# Patient Record
Sex: Female | Born: 1995
Health system: Southern US, Community
[De-identification: ages and names within clinical notes are randomized; demographics above are authoritative.]

## PROBLEM LIST (undated history)

## (undated) DIAGNOSIS — R42 Dizziness and giddiness: Secondary | ICD-10-CM

## (undated) HISTORY — DX: Dizziness and giddiness: R42

---
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
## 2017-01-31 ENCOUNTER — Encounter: Payer: Self-pay | Admitting: Dietician

## 2017-01-31 NOTE — Progress Notes (Signed)
   [facility]  Visit Type:  Initial  Appt. Start Time: 2:15pm Appt. End Time: 3:00pm  01/30/17  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 Reports wt loss of 25# last year which she has regained and wants to lose again. Methods used for previous wt loss include increase in cardiovascular activities (running longer distances) and drastically reducing the amount of carbohydrates in her diet. This season she reports a change in training schedule d/t new [employer] coach, IE not running as much, but also is not eating at the dining halls as often and instead is choosing to cook more meals at home. Reports being a picky eater. Main vegetables consumed include asparagus, broccoli, potatoes, corn. Believes snack quality/selection to be one of her main "issue" areas hindering weight loss. Does not usually eat a snack prior to practice. Eats when it's "conventient" throughout the day.  Current workout schedule  M/W/F: 1.5hrs training followed by 1hr lift T/Th: pick up games with teammates Weekends: off  Usual eating schedule: Breakfast: not always, may have a snack food like goldfish or a fruit if at home. Occasionally will go to the dining hall and select omelet (cheese, bacon, spinach) + grits + pancake Lunch and Dinner: if at [employer] she chooses from provided selections- likes most meats, some vegetables, mashed potatoes. If at home she cooks a lot of pasta dishes. Also likes caesar salads with her meal Snacks: goldfish, fruit, poptarts, Nutrigrain bars, fruit rollups, popcorn, fruit snacks   Individualized Plan:   Learning Objective:  Patient will have a greater understanding of how to fuel properly for her sport (types of foods, meal / snack timing, ratio of different macronutrients, etc.) and of how to lose weight in a healthy way with  minimal impact on athletic performance.  RD to provide list of healthy meal ideas for athlete to cook at home. Will provide this list at our follow up appointment.   Expected Outcomes:     1. Reach goal weight 2. Improved athletic performance 3. Consumption of more nutrient-dense snacks and more balanced meals  Education material provided: healthy snack ideas  If problems or questions, patient to contact team via:  Phone and Email  Future appointment:  Thursday Oct 4th :15pm    Marianna Payment, RD 01/30/17

## 2017-03-18 ENCOUNTER — Other Ambulatory Visit: Payer: Self-pay | Admitting: Family Medicine

## 2017-03-18 DIAGNOSIS — S4992XA Unspecified injury of left shoulder and upper arm, initial encounter: Secondary | ICD-10-CM

## 2017-03-19 ENCOUNTER — Ambulatory Visit
Admission: RE | Admit: 2017-03-19 | Discharge: 2017-03-19 | Disposition: A | Discharge status: Home / Self Care | Payer: Federal, State, Local not specified - PPO | Source: Ambulatory Visit | Attending: Family Medicine | Admitting: Family Medicine

## 2017-03-19 ENCOUNTER — Other Ambulatory Visit: Payer: Self-pay | Admitting: Family Medicine

## 2017-03-19 DIAGNOSIS — S4992XA Unspecified injury of left shoulder and upper arm, initial encounter: Secondary | ICD-10-CM

## 2017-03-19 DIAGNOSIS — Y9367 Activity, basketball: Secondary | ICD-10-CM | POA: Insufficient documentation

## 2017-03-19 DIAGNOSIS — M25512 Pain in left shoulder: Secondary | ICD-10-CM | POA: Diagnosis not present

## 2017-03-19 MED ORDER — NAPROXEN 500 MG PO TABS: 500.0000 mg | ORAL_TABLET | Freq: Two times a day (BID) | ORAL | 0 refills | Status: DC

## 2017-03-24 DIAGNOSIS — S4352XA Sprain of left acromioclavicular joint, initial encounter: Secondary | ICD-10-CM | POA: Diagnosis not present

## 2017-03-25 ENCOUNTER — Other Ambulatory Visit: Payer: Self-pay | Admitting: Family Medicine

## 2017-03-25 DIAGNOSIS — M898X9 Other specified disorders of bone, unspecified site: Secondary | ICD-10-CM

## 2017-03-26 ENCOUNTER — Ambulatory Visit
Admission: RE | Admit: 2017-03-26 | Discharge: 2017-03-26 | Disposition: A | Discharge status: Home / Self Care | Payer: Federal, State, Local not specified - PPO | Source: Ambulatory Visit | Attending: Family Medicine | Admitting: Family Medicine

## 2017-03-26 DIAGNOSIS — M898X9 Other specified disorders of bone, unspecified site: Secondary | ICD-10-CM | POA: Insufficient documentation

## 2017-03-26 DIAGNOSIS — M25512 Pain in left shoulder: Secondary | ICD-10-CM | POA: Diagnosis not present

## 2017-04-10 ENCOUNTER — Encounter: Payer: Self-pay | Admitting: Family Medicine

## 2017-04-10 ENCOUNTER — Ambulatory Visit (INDEPENDENT_AMBULATORY_CARE_PROVIDER_SITE_OTHER): Payer: Federal, State, Local not specified - PPO | Admitting: Family Medicine

## 2017-04-10 VITALS — BP 125/67 | HR 63 | Temp 98.9°F | Resp 14

## 2017-04-10 DIAGNOSIS — R0981 Nasal congestion: Secondary | ICD-10-CM

## 2017-04-10 NOTE — Progress Notes (Signed)
Patient presents today with symptoms of persistent nasal drainage. Patient states that she has had postnasal drip for years. She states that she seems more congested in the recent days. She denies any history of deviated septum/nasal fracture. She denies any colored mucus or fever at this point. She denies any sore throat or cough. She has not been allergy tested before. She does take Zyrtec on a regular basis and also a nasal steroid spray. She has not tried Sudafed in the short-term. She does admit to seeing an ENT in the past who stated that she may just have allergies.  ROS: Negative except mentioned above. Vitals as per Epic. GENERAL: NAD HEENT: mild pharyngeal erythema, no exudate, no erythema of TMs, mild erythema and swelling of bilateral nares, no sinus tenderness, no cervical LAD RESP: CTA B CARD: RRR NEURO: CN II-XII grossly intact   A/P: Chronic nasal drainage last congestion - patient can try a different antihistamine such as Claritin or Allegra and in the short-term can try taking Sudafed, Ibuprofen when necessary, continue nasal steroid, if symptoms do persist/worsen can see local ENT in the area for further evaluation/treatment and possibly allergy testing. Seek medical attention as needed.

## 2017-08-22 ENCOUNTER — Ambulatory Visit (INDEPENDENT_AMBULATORY_CARE_PROVIDER_SITE_OTHER): Payer: Federal, State, Local not specified - PPO | Admitting: Family Medicine

## 2017-08-22 ENCOUNTER — Encounter: Payer: Self-pay | Admitting: Family Medicine

## 2017-08-22 VITALS — BP 121/63 | HR 85 | Temp 98.2°F | Resp 14

## 2017-08-22 DIAGNOSIS — J029 Acute pharyngitis, unspecified: Secondary | ICD-10-CM | POA: Diagnosis not present

## 2017-08-22 DIAGNOSIS — R0981 Nasal congestion: Secondary | ICD-10-CM | POA: Diagnosis not present

## 2017-08-22 LAB — POCT RAPID STREP A (OFFICE): Rapid Strep A Screen: NEGATIVE

## 2017-08-22 NOTE — Progress Notes (Signed)
Patient presents today with one day history of sore throat, nasal congestion, fatigue. She feels that she may be coming down with something. Patient states that she does take Claritin for her allergies daily. She denies any fever, abdominal pain, severe headache, nausea, vomiting, diarrhea, myalgias. She is not taking any medications today.she does not get her monthly menstrual cycle due to her birth control use.  ROS: Negative except mentioned above. Vitals as per Epic.  GENERAL: NAD HEENT: mild pharyngeal erythema, no exudate, no erythema of TMs, no cervical LAD RESP: CTA B CARD: RRR ABD: positive bowel sounds, nontender, no organomegaly appreciated NEURO: CN II-XII grossly intact   A/P: URI - would recommend continuing to take Claritin for postnasal drip, Sudafed if needed for nasal congestion/pressure, Tylenol/ibuprofen when necessary for headache or fever, rest, hydration, monitor for any worsening symptoms including fever, inform trainer if any worsening symptoms, recommend no class or physical activity if febrile, seek medical attention if any further problems.

## 2018-01-01 DIAGNOSIS — Z3041 Encounter for surveillance of contraceptive pills: Secondary | ICD-10-CM | POA: Diagnosis not present

## 2018-01-01 DIAGNOSIS — K080 Exfoliation of teeth due to systemic causes: Secondary | ICD-10-CM | POA: Diagnosis not present

## 2018-01-01 DIAGNOSIS — Z01419 Encounter for gynecological examination (general) (routine) without abnormal findings: Secondary | ICD-10-CM | POA: Diagnosis not present

## 2018-01-29 DIAGNOSIS — S39012A Strain of muscle, fascia and tendon of lower back, initial encounter: Secondary | ICD-10-CM | POA: Diagnosis not present

## 2018-02-03 DIAGNOSIS — S39012D Strain of muscle, fascia and tendon of lower back, subsequent encounter: Secondary | ICD-10-CM | POA: Diagnosis not present

## 2018-02-04 DIAGNOSIS — S39012D Strain of muscle, fascia and tendon of lower back, subsequent encounter: Secondary | ICD-10-CM | POA: Diagnosis not present

## 2018-02-06 DIAGNOSIS — S39012D Strain of muscle, fascia and tendon of lower back, subsequent encounter: Secondary | ICD-10-CM | POA: Diagnosis not present

## 2018-02-09 DIAGNOSIS — S39012D Strain of muscle, fascia and tendon of lower back, subsequent encounter: Secondary | ICD-10-CM | POA: Diagnosis not present

## 2018-02-10 DIAGNOSIS — S39012D Strain of muscle, fascia and tendon of lower back, subsequent encounter: Secondary | ICD-10-CM | POA: Diagnosis not present

## 2018-02-11 DIAGNOSIS — S39012D Strain of muscle, fascia and tendon of lower back, subsequent encounter: Secondary | ICD-10-CM | POA: Diagnosis not present

## 2018-02-13 DIAGNOSIS — S39012D Strain of muscle, fascia and tendon of lower back, subsequent encounter: Secondary | ICD-10-CM | POA: Diagnosis not present

## 2018-02-14 DIAGNOSIS — S39012D Strain of muscle, fascia and tendon of lower back, subsequent encounter: Secondary | ICD-10-CM | POA: Diagnosis not present

## 2018-02-16 DIAGNOSIS — S39012D Strain of muscle, fascia and tendon of lower back, subsequent encounter: Secondary | ICD-10-CM | POA: Diagnosis not present

## 2018-02-17 DIAGNOSIS — S39012D Strain of muscle, fascia and tendon of lower back, subsequent encounter: Secondary | ICD-10-CM | POA: Diagnosis not present

## 2018-02-18 DIAGNOSIS — S39012D Strain of muscle, fascia and tendon of lower back, subsequent encounter: Secondary | ICD-10-CM | POA: Diagnosis not present

## 2018-02-19 DIAGNOSIS — S39012D Strain of muscle, fascia and tendon of lower back, subsequent encounter: Secondary | ICD-10-CM | POA: Diagnosis not present

## 2018-02-20 DIAGNOSIS — S39012D Strain of muscle, fascia and tendon of lower back, subsequent encounter: Secondary | ICD-10-CM | POA: Diagnosis not present

## 2018-02-21 DIAGNOSIS — S39012D Strain of muscle, fascia and tendon of lower back, subsequent encounter: Secondary | ICD-10-CM | POA: Diagnosis not present

## 2018-02-23 DIAGNOSIS — S39012D Strain of muscle, fascia and tendon of lower back, subsequent encounter: Secondary | ICD-10-CM | POA: Diagnosis not present

## 2018-02-24 DIAGNOSIS — S39012D Strain of muscle, fascia and tendon of lower back, subsequent encounter: Secondary | ICD-10-CM | POA: Diagnosis not present

## 2018-03-02 DIAGNOSIS — S39012D Strain of muscle, fascia and tendon of lower back, subsequent encounter: Secondary | ICD-10-CM | POA: Diagnosis not present

## 2018-03-03 DIAGNOSIS — S39012D Strain of muscle, fascia and tendon of lower back, subsequent encounter: Secondary | ICD-10-CM | POA: Diagnosis not present

## 2018-03-04 DIAGNOSIS — S39012D Strain of muscle, fascia and tendon of lower back, subsequent encounter: Secondary | ICD-10-CM | POA: Diagnosis not present

## 2018-03-09 DIAGNOSIS — S39012D Strain of muscle, fascia and tendon of lower back, subsequent encounter: Secondary | ICD-10-CM | POA: Diagnosis not present

## 2018-04-16 ENCOUNTER — Ambulatory Visit (INDEPENDENT_AMBULATORY_CARE_PROVIDER_SITE_OTHER): Payer: Federal, State, Local not specified - PPO | Admitting: Family Medicine

## 2018-04-16 ENCOUNTER — Encounter: Payer: Self-pay | Admitting: Family Medicine

## 2018-04-16 VITALS — Resp 14

## 2018-04-16 DIAGNOSIS — S99921A Unspecified injury of right foot, initial encounter: Secondary | ICD-10-CM

## 2018-04-16 MED ORDER — NAPROXEN 500 MG PO TABS: 500.0000 mg | ORAL_TABLET | Freq: Two times a day (BID) | ORAL | 0 refills | Status: AC

## 2018-04-20 ENCOUNTER — Ambulatory Visit
Admission: RE | Admit: 2018-04-20 | Discharge: 2018-04-20 | Disposition: A | Discharge status: Home / Self Care | Payer: Federal, State, Local not specified - PPO | Source: Ambulatory Visit | Attending: Family Medicine | Admitting: Family Medicine

## 2018-04-20 ENCOUNTER — Ambulatory Visit
Admission: RE | Admit: 2018-04-20 | Discharge: 2018-04-20 | Disposition: A | Discharge status: Home / Self Care | Payer: Federal, State, Local not specified - PPO | Attending: Family Medicine | Admitting: Family Medicine

## 2018-04-20 DIAGNOSIS — S99921A Unspecified injury of right foot, initial encounter: Secondary | ICD-10-CM | POA: Diagnosis not present

## 2018-04-20 DIAGNOSIS — M79674 Pain in right toe(s): Secondary | ICD-10-CM | POA: Diagnosis not present

## 2018-05-26 ENCOUNTER — Ambulatory Visit (INDEPENDENT_AMBULATORY_CARE_PROVIDER_SITE_OTHER): Payer: Federal, State, Local not specified - PPO | Admitting: Family Medicine

## 2018-05-26 VITALS — BP 112/69 | HR 75 | Temp 98.4°F | Resp 14

## 2018-05-26 DIAGNOSIS — K529 Noninfective gastroenteritis and colitis, unspecified: Secondary | ICD-10-CM

## 2018-05-26 MED ORDER — ONDANSETRON 4 MG PO TBDP: 4.0000 mg | ORAL_TABLET | Freq: Three times a day (TID) | ORAL | 0 refills | Status: AC | PRN

## 2018-05-26 MED ORDER — DICYCLOMINE HCL 20 MG PO TABS: 20.0000 mg | ORAL_TABLET | Freq: Three times a day (TID) | ORAL | 0 refills | Status: AC

## 2018-05-26 NOTE — Progress Notes (Signed)
Patient presents today with symptoms of nausea, vomiting, diarrhea.  Patient states that her symptoms started early Monday morning.  She ate some pasta and vegetables with meat sauce on Sunday night.  She denied any alcohol use or drug use.  She stopped vomiting yesterday afternoon.  She has had some loose bowel movements since that time.  She has been able to tolerate fluids today.  She denies any significant abdominal pain but does have abdominal cramping at times.  She denies any fever, bloody diarrhea, urinary symptoms, upper respiratory symptoms, chest pain, shortness of breath, severe headache.  She is on birth control and does not get her menstrual cycles.  She did not go to class today.  Has tried Alka-Seltzer for her symptoms with some relief.  ROS: Negative except mentioned above. Vitals as per Epic. GENERAL: NAD HEENT: no pharyngeal erythema, no exudate, no erythema of TMs, no cervical LAD RESP: CTA B CARD: RRR ABD: Positive bowel sounds, no significant tenderness, no rebound or guarding appreciated, no flank tenderness NEURO: CN II-XII grossly intact   A/P: Gastroenteritis - rest, hydration, Tylenol as needed, Bentyl prescribed if patient wants to take it for cramping, Zofran prescribed if patient wants to take it for nausea, BRAT Diet discussed, no athletic activity or class today, can return back to class I symptoms have resolved, once patient is tolerating normal diet and symptoms have resolved can return back to physical activity, will discuss plan with athletic trainer.  Seek medical attention if symptoms persist or worsen as discussed.

## 2018-10-25 NOTE — Progress Notes (Signed)
Patient presents with pain of right first toe. She believes someone stepped on it during a basketball game a few weeks ago. She has been able to play through it. Has been taping it. Denies any bleeding or bruising of the area. Denies any alcohol use or any other joint pain. Denies any previous history of toe problems in the past.   ROS: Negative except mentioned above. Vitals as per Epic.  GENERAL: NAD MSK: R First Toe - slight hallus valgus, nail intact, no significant swelling or ecchymosis, tenderness along MTP joint with flexion and extension, no tenderness along plantar surface, nv intact NEURO: CN II-XII grossly intact   A/P: R First Toe Pain - will do Xray, tape as needed, discussed trainer trying certain insert, discussed proper shoewear, NSAIDs prn, activity as tolerated, discussed plan with athletic trainer.

## 2018-11-13 ENCOUNTER — Other Ambulatory Visit: Payer: Self-pay | Admitting: *Deleted

## 2018-11-13 DIAGNOSIS — Z20822 Contact with and (suspected) exposure to covid-19: Secondary | ICD-10-CM

## 2018-11-16 ENCOUNTER — Other Ambulatory Visit: Payer: Self-pay

## 2018-11-16 DIAGNOSIS — Z20822 Contact with and (suspected) exposure to covid-19: Secondary | ICD-10-CM

## 2018-11-16 DIAGNOSIS — R6889 Other general symptoms and signs: Secondary | ICD-10-CM | POA: Diagnosis not present

## 2018-11-21 LAB — NOVEL CORONAVIRUS, NAA: SARS-CoV-2, NAA: NOT DETECTED

## 2018-11-25 ENCOUNTER — Other Ambulatory Visit: Payer: Self-pay | Admitting: Family Medicine

## 2018-11-27 LAB — 5+CRT-BUND
Amphetamines, Urine: NEGATIVE ng/mL
Cannabinoid Quant, Ur: NEGATIVE ng/mL
Cocaine (Metab.): NEGATIVE ng/mL
Creatinine, Urine: 98.7 mg/dL (ref 20.0–300.0)
OPIATE QUANTITATIVE URINE: NEGATIVE ng/mL
PCP Quant, Ur: NEGATIVE ng/mL
pH, Urine: 6.5 (ref 4.5–8.9)

## 2019-01-06 DIAGNOSIS — N946 Dysmenorrhea, unspecified: Secondary | ICD-10-CM | POA: Diagnosis not present

## 2019-01-06 DIAGNOSIS — Z1239 Encounter for other screening for malignant neoplasm of breast: Secondary | ICD-10-CM | POA: Diagnosis not present

## 2019-01-06 DIAGNOSIS — Z01419 Encounter for gynecological examination (general) (routine) without abnormal findings: Secondary | ICD-10-CM | POA: Diagnosis not present

## 2019-01-06 DIAGNOSIS — Z793 Long term (current) use of hormonal contraceptives: Secondary | ICD-10-CM | POA: Diagnosis not present

## 2019-01-20 DIAGNOSIS — Z20828 Contact with and (suspected) exposure to other viral communicable diseases: Secondary | ICD-10-CM | POA: Diagnosis not present

## 2019-02-02 ENCOUNTER — Other Ambulatory Visit: Payer: Self-pay | Admitting: *Deleted

## 2019-02-02 DIAGNOSIS — Z20822 Contact with and (suspected) exposure to covid-19: Secondary | ICD-10-CM

## 2019-02-02 DIAGNOSIS — R6889 Other general symptoms and signs: Secondary | ICD-10-CM | POA: Diagnosis not present

## 2019-02-03 LAB — NOVEL CORONAVIRUS, NAA: SARS-CoV-2, NAA: NOT DETECTED

## 2019-02-10 ENCOUNTER — Other Ambulatory Visit: Payer: Self-pay

## 2019-02-10 DIAGNOSIS — Z20822 Contact with and (suspected) exposure to covid-19: Secondary | ICD-10-CM

## 2019-02-11 LAB — NOVEL CORONAVIRUS, NAA: SARS-CoV-2, NAA: NOT DETECTED

## 2019-02-24 DIAGNOSIS — Z20828 Contact with and (suspected) exposure to other viral communicable diseases: Secondary | ICD-10-CM | POA: Diagnosis not present

## 2019-03-03 DIAGNOSIS — Z20828 Contact with and (suspected) exposure to other viral communicable diseases: Secondary | ICD-10-CM | POA: Diagnosis not present

## 2019-03-11 DIAGNOSIS — Z20828 Contact with and (suspected) exposure to other viral communicable diseases: Secondary | ICD-10-CM | POA: Diagnosis not present

## 2019-03-17 DIAGNOSIS — Z20828 Contact with and (suspected) exposure to other viral communicable diseases: Secondary | ICD-10-CM | POA: Diagnosis not present

## 2019-03-23 DIAGNOSIS — Z20828 Contact with and (suspected) exposure to other viral communicable diseases: Secondary | ICD-10-CM | POA: Diagnosis not present

## 2019-03-29 DIAGNOSIS — Z20828 Contact with and (suspected) exposure to other viral communicable diseases: Secondary | ICD-10-CM | POA: Diagnosis not present

## 2019-03-31 DIAGNOSIS — Z20828 Contact with and (suspected) exposure to other viral communicable diseases: Secondary | ICD-10-CM | POA: Diagnosis not present

## 2019-04-02 DIAGNOSIS — Z20828 Contact with and (suspected) exposure to other viral communicable diseases: Secondary | ICD-10-CM | POA: Diagnosis not present

## 2019-04-06 DIAGNOSIS — Z20828 Contact with and (suspected) exposure to other viral communicable diseases: Secondary | ICD-10-CM | POA: Diagnosis not present

## 2019-04-10 DIAGNOSIS — Z20828 Contact with and (suspected) exposure to other viral communicable diseases: Secondary | ICD-10-CM | POA: Diagnosis not present

## 2019-04-12 DIAGNOSIS — Z20828 Contact with and (suspected) exposure to other viral communicable diseases: Secondary | ICD-10-CM | POA: Diagnosis not present

## 2019-08-07 IMAGING — CR DG CLAVICLE*L*
1 series · 2 of 2 positions shown · non-contrast
Comparison: None.

CLINICAL DATA: Pt states she collided with another player during a
basketball game yesterday. Pain anterior left clavicle.

EXAM:
LEFT CLAVICLE - 2+ VIEWS

[Series 1: dg clavicle left · 0.14mm/px · 2 of 2 slices shown]
[im 1/2]
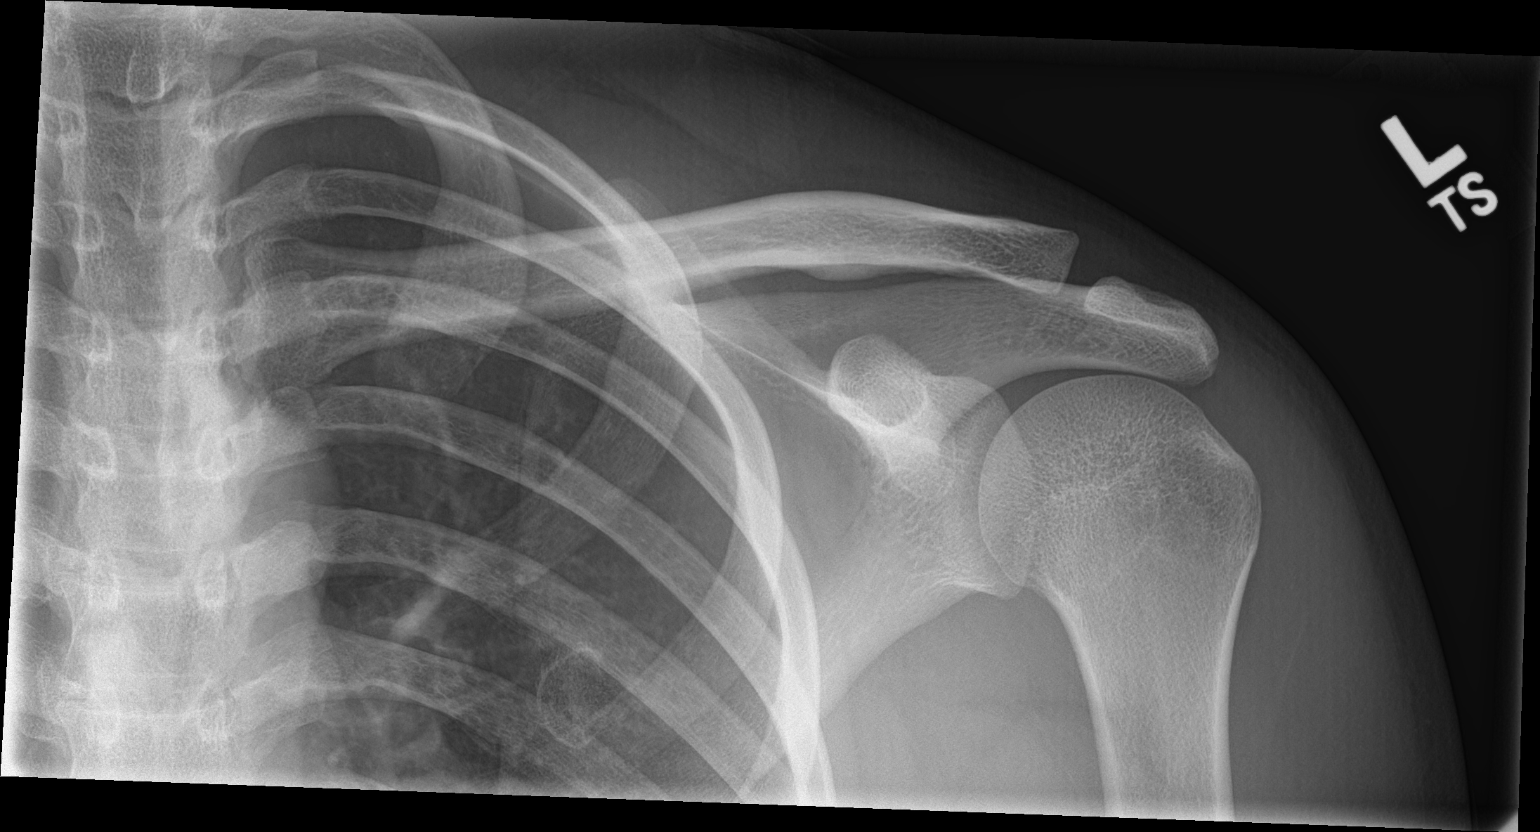
[im 2/2]
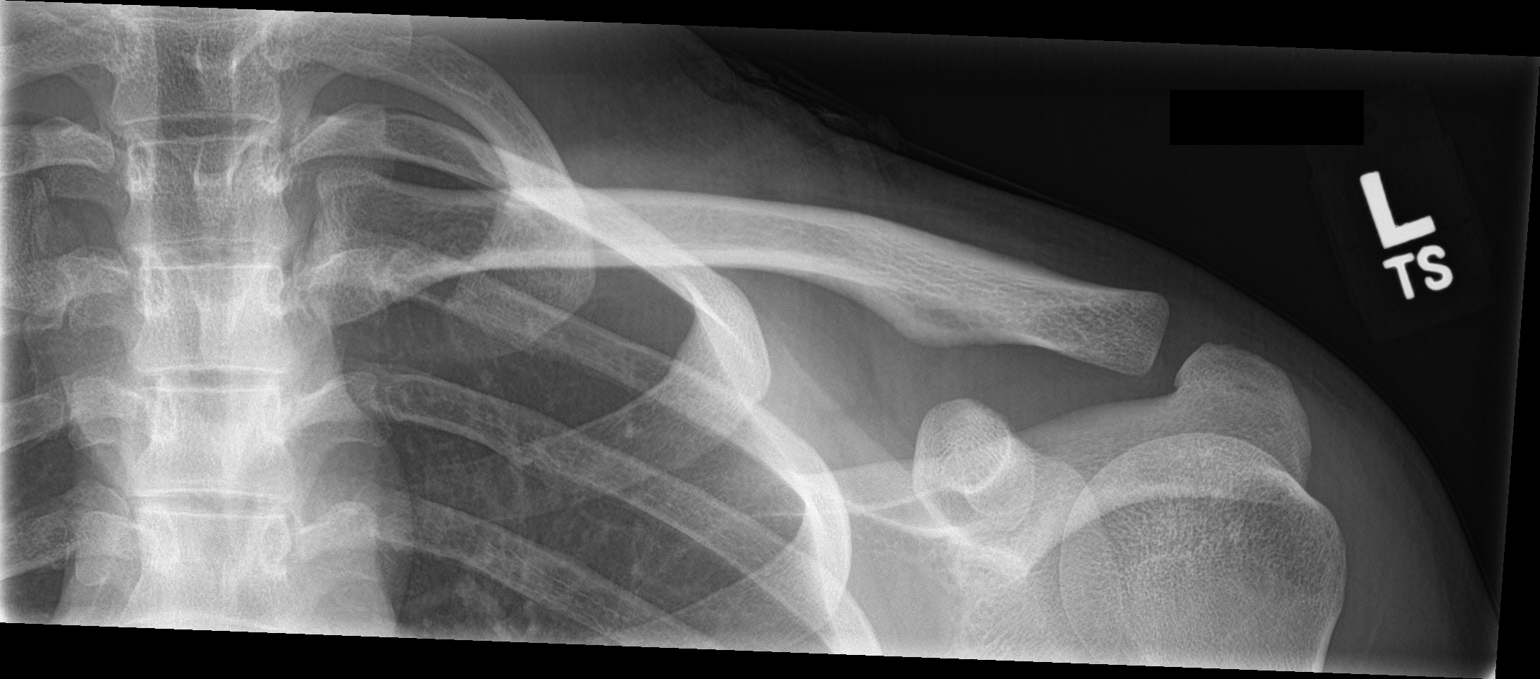

[2 of 2 positions shown; findings below may reference images not displayed]

FINDINGS: There is no evidence of fracture or other focal bone lesions. The
visualized glenohumeral joint is normal. The acromioclavicular joint
is normal. The of soft tissues are unremarkable.
IMPRESSION: No acute osseous injury of the left clavicle.

## 2019-08-09 ENCOUNTER — Ambulatory Visit: Payer: Federal, State, Local not specified - PPO | Attending: Internal Medicine

## 2019-08-09 DIAGNOSIS — Z23 Encounter for immunization: Secondary | ICD-10-CM

## 2019-08-09 NOTE — Progress Notes (Signed)
   Cov[facility] Vaccination [facility]  Name:  Madison Burke    MRN: [id number] DOB: 04/08/1996  08/09/2019  Madison Burke was observed post Covid-19 immunization for 15 minutes without incident. She was provided with Vaccine Information Sheet and instruction to access the V-Safe system.   Madison Burke was instructed to call 911 with any severe reactions post vaccine: Marland Kitchen Difficulty breathing  . Swelling of face and throat  . A fast heartbeat  . A bad rash all over body  . Dizziness and weakness   Immunizations Administered    Name Date Dose VIS Date Route   Pfizer COVID-19 Vaccine 08/09/2019  2:29 PM 0.3 mL 04/23/2019 Intramuscular   Manufacturer: [employer], [employer]   Lot: [id number]   NDC: [address]-8

## 2019-08-14 IMAGING — CR DG SHOULDER 2+V*L*
1 series · 4 of 4 positions shown · non-contrast
Comparison: 03/19/2017

CLINICAL DATA: Possible bone mass

EXAM:
LEFT SHOULDER - 2+ VIEW

[Series 1: dg shoulder left · 0.14mm/px · 4 of 4 slices shown]
[im 1/4]
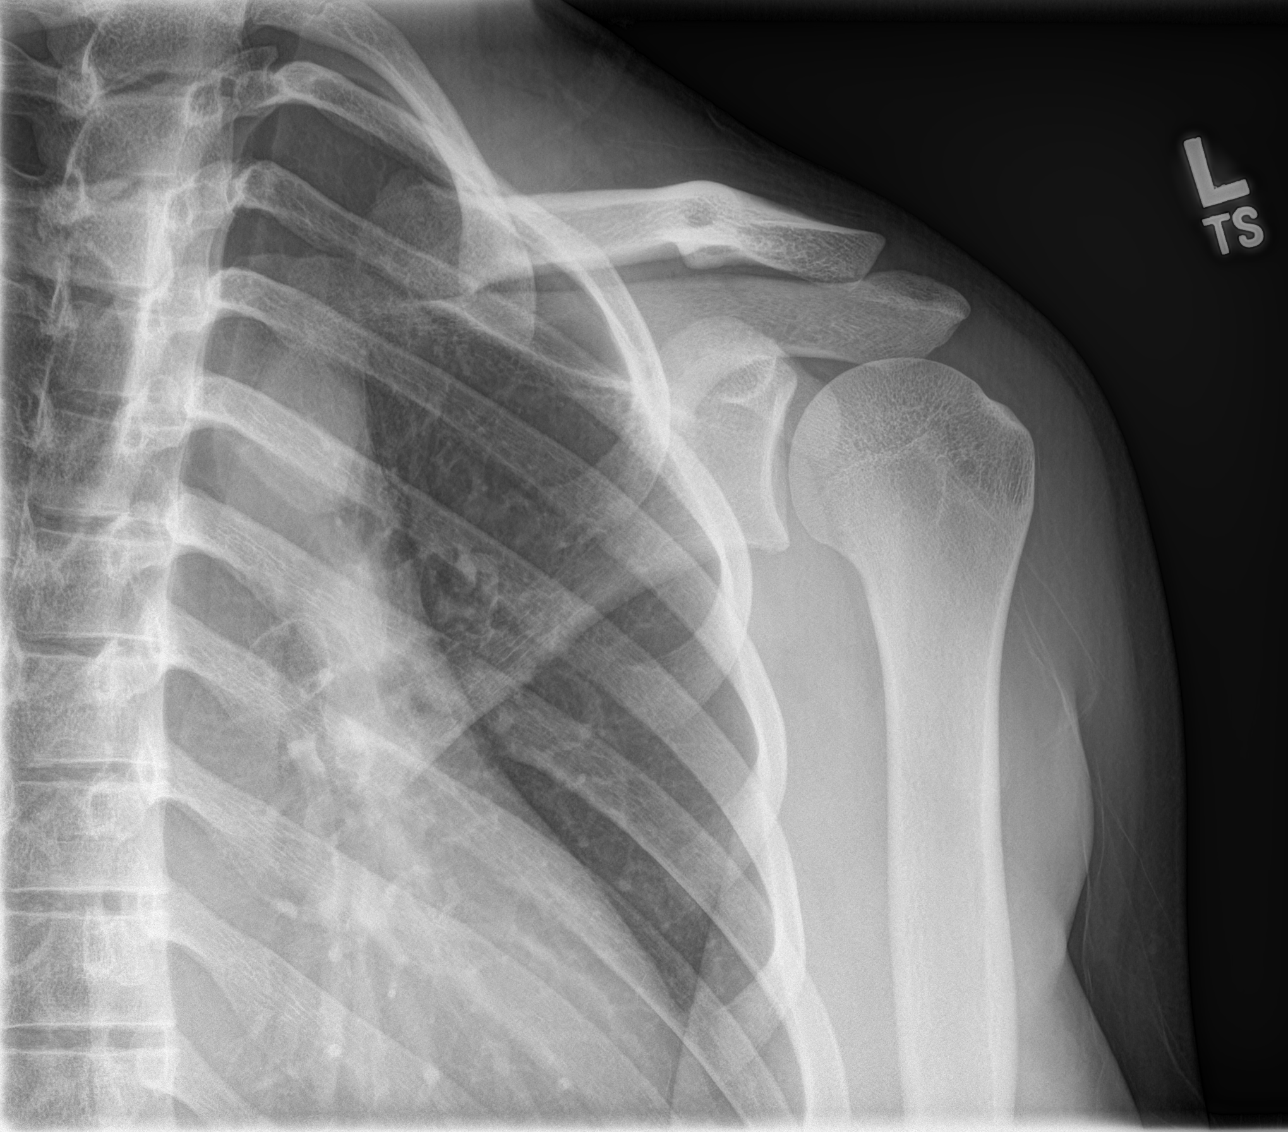
[im 2/4]
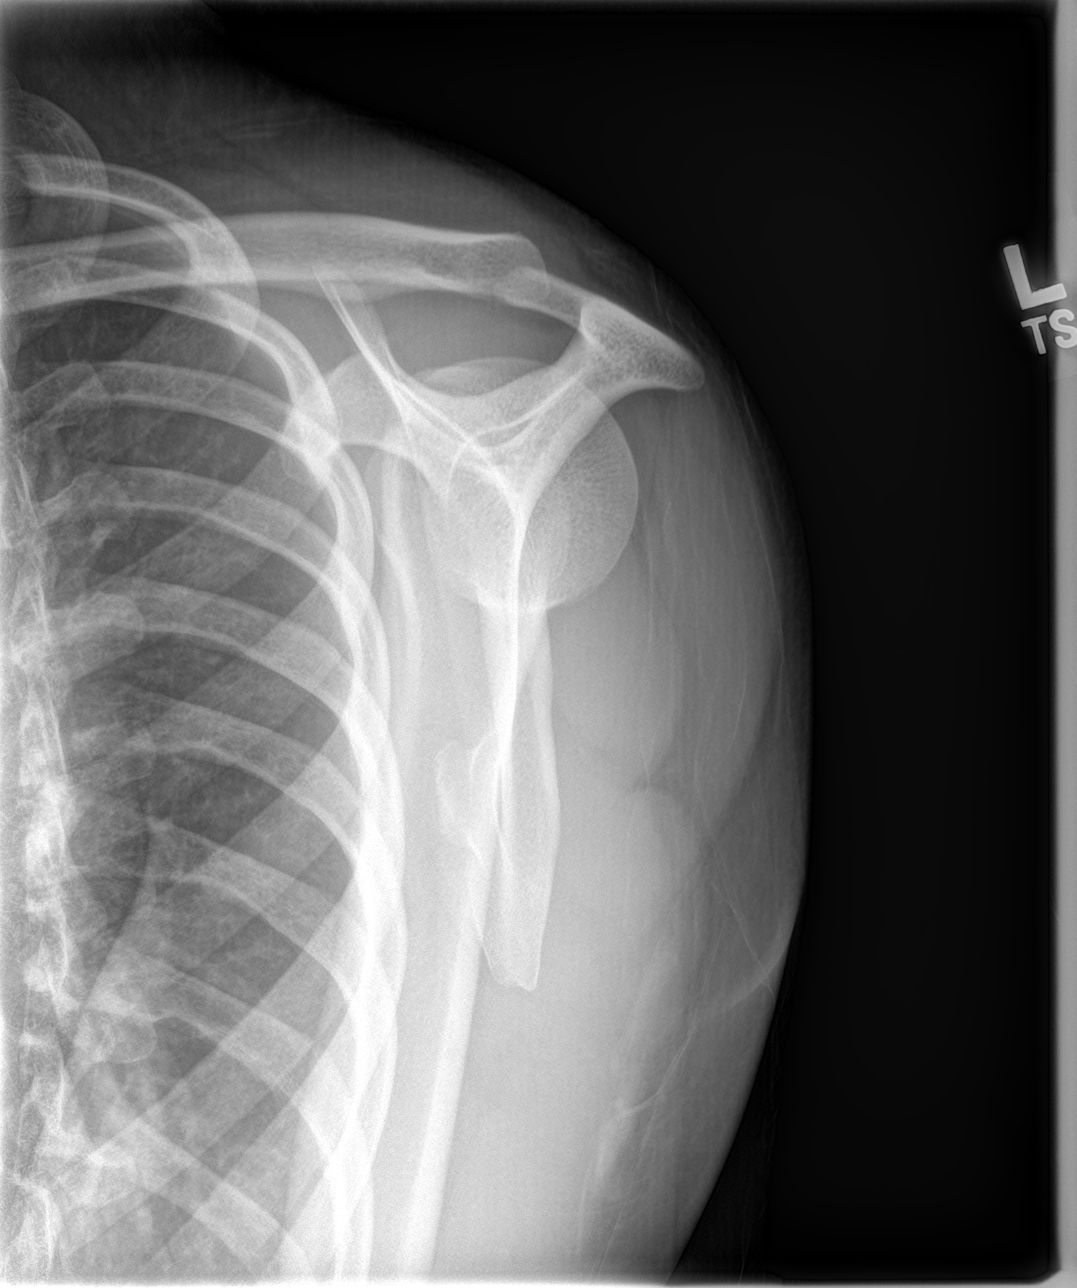
[im 3/4]
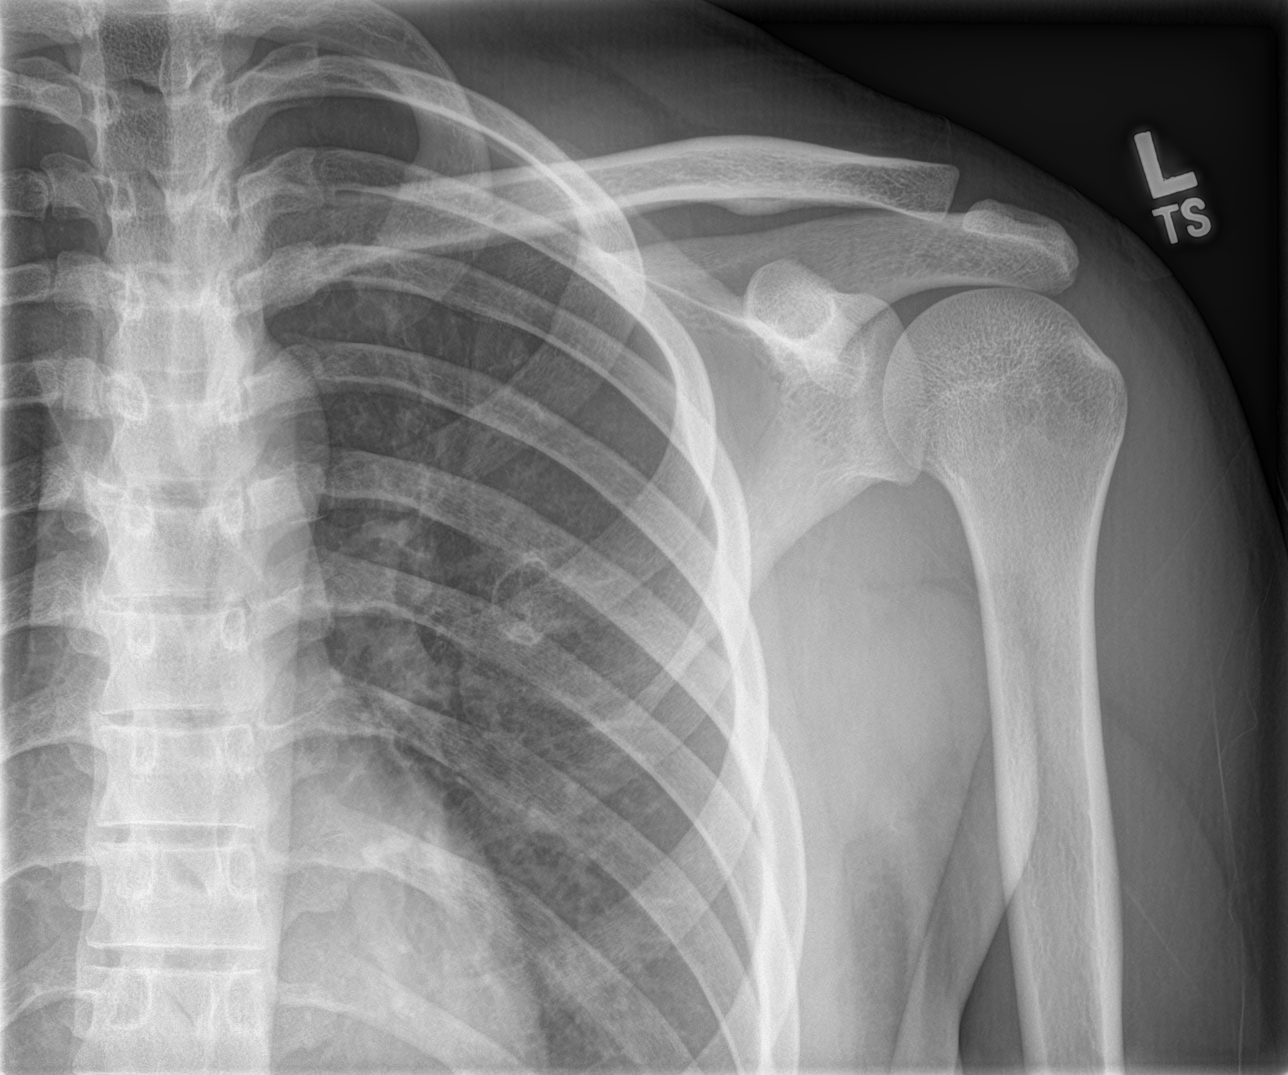
[im 4/4]
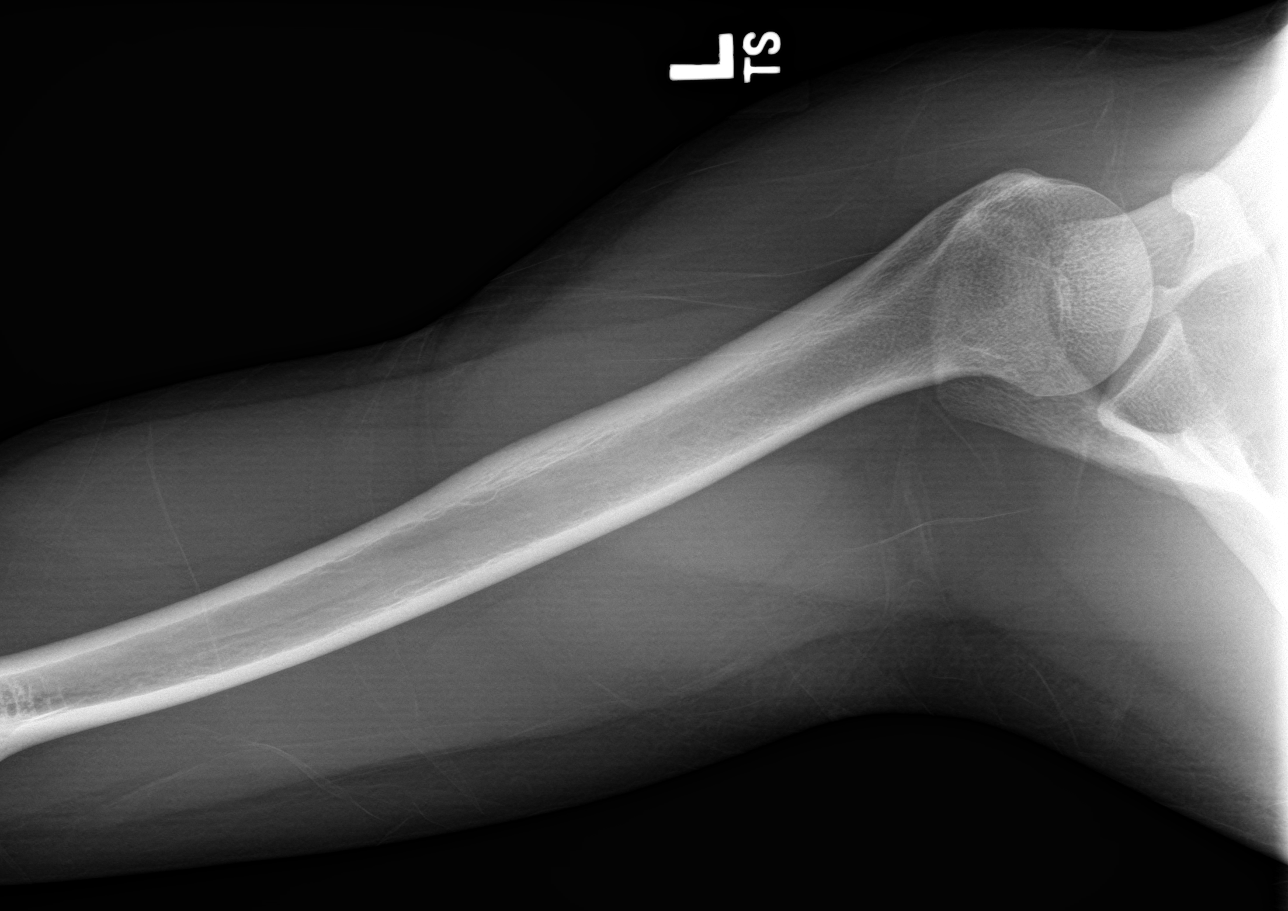

[4 of 4 positions shown; findings below may reference images not displayed]

FINDINGS: Probable exostosis off the inferior left scapula. No fracture or
malalignment. The left lung apex is clear.
IMPRESSION: Probable exostosis off the inferior aspect of the left scapula

## 2019-08-30 ENCOUNTER — Ambulatory Visit: Payer: Federal, State, Local not specified - PPO

## 2019-08-30 ENCOUNTER — Ambulatory Visit: Payer: PRIVATE HEALTH INSURANCE | Attending: Internal Medicine

## 2019-08-30 DIAGNOSIS — Z23 Encounter for immunization: Secondary | ICD-10-CM

## 2019-08-30 NOTE — Progress Notes (Signed)
   Covid-19 Vaccination Clinic  Name:  Sharmon Cheramie    MRN: [id number] DOB: 1995/08/07  08/30/2019  Ms. Sobecki was observed post Covid-19 immunization for 15 minutes without incident. She was provided with Vaccine Information Sheet and instruction to access the V-Safe system.   Ms. Constantine was instructed to call 911 with any severe reactions post vaccine: Marland Kitchen Difficulty breathing  . Swelling of face and throat  . A fast heartbeat  . A bad rash all over body  . Dizziness and weakness   Immunizations Administered    Name Date Dose VIS Date Route   Pfizer COVID-19 Vaccine 08/30/2019 11:50 AM 0.3 mL 07/07/2018 Intramuscular   Manufacturer: [employer], [employer]   Lot: [id number]   NDC: [address]-2

## 2019-09-22 DIAGNOSIS — Z3041 Encounter for surveillance of contraceptive pills: Secondary | ICD-10-CM | POA: Diagnosis not present

## 2020-02-18 DIAGNOSIS — Z01419 Encounter for gynecological examination (general) (routine) without abnormal findings: Secondary | ICD-10-CM | POA: Diagnosis not present

## 2020-02-18 DIAGNOSIS — Z3041 Encounter for surveillance of contraceptive pills: Secondary | ICD-10-CM | POA: Diagnosis not present

## 2020-02-18 DIAGNOSIS — Z124 Encounter for screening for malignant neoplasm of cervix: Secondary | ICD-10-CM | POA: Diagnosis not present

## 2021-01-01 DIAGNOSIS — R051 Acute cough: Secondary | ICD-10-CM | POA: Diagnosis not present

## 2021-01-01 DIAGNOSIS — R070 Pain in throat: Secondary | ICD-10-CM | POA: Diagnosis not present

## 2021-01-01 DIAGNOSIS — J069 Acute upper respiratory infection, unspecified: Secondary | ICD-10-CM | POA: Diagnosis not present

## 2021-01-01 DIAGNOSIS — Z20822 Contact with and (suspected) exposure to covid-19: Secondary | ICD-10-CM | POA: Diagnosis not present
